# Patient Record
Sex: Male | Born: 1955 | Hispanic: No | Marital: Married | Smoking: Never smoker
Health system: Southern US, Community
[De-identification: ages and names within clinical notes are randomized; demographics above are authoritative.]

## PROBLEM LIST (undated history)

## (undated) DIAGNOSIS — G510 Bell's palsy: Secondary | ICD-10-CM

## (undated) HISTORY — DX: Bell's palsy: G51.0

## (undated) HISTORY — PX: HAND SURGERY: SHX662

---

## 2017-03-21 ENCOUNTER — Encounter (HOSPITAL_COMMUNITY): Payer: Self-pay | Admitting: Emergency Medicine

## 2017-03-21 ENCOUNTER — Emergency Department (HOSPITAL_COMMUNITY): Payer: 59

## 2017-03-21 ENCOUNTER — Emergency Department (HOSPITAL_COMMUNITY)
Admission: EM | Admit: 2017-03-21 | Discharge: 2017-03-21 | Disposition: A | Discharge status: Home / Self Care | Payer: 59 | Attending: Emergency Medicine | Admitting: Emergency Medicine

## 2017-03-21 DIAGNOSIS — Z5181 Encounter for therapeutic drug level monitoring: Secondary | ICD-10-CM | POA: Insufficient documentation

## 2017-03-21 DIAGNOSIS — I639 Cerebral infarction, unspecified: Secondary | ICD-10-CM | POA: Diagnosis not present

## 2017-03-21 DIAGNOSIS — R200 Anesthesia of skin: Secondary | ICD-10-CM | POA: Diagnosis present

## 2017-03-21 DIAGNOSIS — Z79899 Other long term (current) drug therapy: Secondary | ICD-10-CM | POA: Diagnosis not present

## 2017-03-21 DIAGNOSIS — G510 Bell's palsy: Secondary | ICD-10-CM

## 2017-03-21 DIAGNOSIS — R29810 Facial weakness: Secondary | ICD-10-CM

## 2017-03-21 DIAGNOSIS — R29818 Other symptoms and signs involving the nervous system: Secondary | ICD-10-CM | POA: Diagnosis not present

## 2017-03-21 LAB — URINALYSIS, ROUTINE W REFLEX MICROSCOPIC
Bacteria, UA: NONE SEEN
Bilirubin Urine: NEGATIVE
GLUCOSE, UA: NEGATIVE mg/dL
Ketones, ur: NEGATIVE mg/dL
Leukocytes, UA: NEGATIVE
NITRITE: NEGATIVE
PH: 7 (ref 5.0–8.0)
PROTEIN: NEGATIVE mg/dL
SPECIFIC GRAVITY, URINE: 1.011 (ref 1.005–1.030)
Squamous Epithelial / LPF: NONE SEEN

## 2017-03-21 LAB — I-STAT CHEM 8, ED
BUN: 11 mg/dL (ref 6–20)
CALCIUM ION: 1.11 mmol/L — AB (ref 1.15–1.40)
CHLORIDE: 106 mmol/L (ref 101–111)
Creatinine, Ser: 1 mg/dL (ref 0.61–1.24)
Glucose, Bld: 94 mg/dL (ref 65–99)
HCT: 45 % (ref 39.0–52.0)
Hemoglobin: 15.3 g/dL (ref 13.0–17.0)
Potassium: 3.9 mmol/L (ref 3.5–5.1)
Sodium: 140 mmol/L (ref 135–145)
TCO2: 25 mmol/L (ref 0–100)

## 2017-03-21 LAB — COMPREHENSIVE METABOLIC PANEL
ALK PHOS: 59 U/L (ref 38–126)
ALT: 26 U/L (ref 17–63)
AST: 27 U/L (ref 15–41)
Albumin: 4.3 g/dL (ref 3.5–5.0)
Anion gap: 9 (ref 5–15)
BUN: 11 mg/dL (ref 6–20)
CALCIUM: 9 mg/dL (ref 8.9–10.3)
CHLORIDE: 107 mmol/L (ref 101–111)
CO2: 23 mmol/L (ref 22–32)
CREATININE: 0.99 mg/dL (ref 0.61–1.24)
GFR calc non Af Amer: >60 mL/min (ref >60)
GLUCOSE: 99 mg/dL (ref 65–99)
Potassium: 4.1 mmol/L (ref 3.5–5.1)
SODIUM: 139 mmol/L (ref 135–145)
Total Bilirubin: 0.8 mg/dL (ref 0.3–1.2)
Total Protein: 7.6 g/dL (ref 6.5–8.1)

## 2017-03-21 LAB — DIFFERENTIAL
BASOS ABS: 0 10*3/uL (ref 0.0–0.1)
BASOS PCT: 1 %
Eosinophils Absolute: 0.2 10*3/uL (ref 0.0–0.7)
Eosinophils Relative: 3 %
Lymphocytes Relative: 26 %
Lymphs Abs: 1.7 10*3/uL (ref 0.7–4.0)
MONO ABS: 0.5 10*3/uL (ref 0.1–1.0)
Monocytes Relative: 7 %
NEUTROS ABS: 4.2 10*3/uL (ref 1.7–7.7)
Neutrophils Relative %: 63 %

## 2017-03-21 LAB — RAPID URINE DRUG SCREEN, HOSP PERFORMED
AMPHETAMINES: NOT DETECTED
BARBITURATES: NOT DETECTED
BENZODIAZEPINES: NOT DETECTED
Cocaine: NOT DETECTED
Opiates: NOT DETECTED
TETRAHYDROCANNABINOL: NOT DETECTED

## 2017-03-21 LAB — I-STAT TROPONIN, ED: Troponin i, poc: 0 ng/mL (ref 0.00–0.08)

## 2017-03-21 LAB — APTT: APTT: 27 s (ref 24–36)

## 2017-03-21 LAB — PROTIME-INR
INR: 0.97
Prothrombin Time: 12.9 seconds (ref 11.4–15.2)

## 2017-03-21 LAB — CBC
HEMATOCRIT: 45.4 % (ref 39.0–52.0)
Hemoglobin: 16.1 g/dL (ref 13.0–17.0)
MCH: 33.1 pg (ref 26.0–34.0)
MCHC: 35.5 g/dL (ref 30.0–36.0)
MCV: 93.2 fL (ref 78.0–100.0)
PLATELETS: 207 10*3/uL (ref 150–400)
RBC: 4.87 MIL/uL (ref 4.22–5.81)
RDW: 13 % (ref 11.5–15.5)
WBC: 6.5 10*3/uL (ref 4.0–10.5)

## 2017-03-21 LAB — ETHANOL: Alcohol, Ethyl (B): <5 mg/dL (ref <5)

## 2017-03-21 MED ORDER — ARTIFICIAL TEARS OPHTHALMIC OINT: TOPICAL_OINTMENT | Freq: Every day | OPHTHALMIC | 0 refills | Status: AC

## 2017-03-21 MED ORDER — VALACYCLOVIR HCL 1 G PO TABS: 1000.0000 mg | ORAL_TABLET | Freq: Three times a day (TID) | ORAL | 0 refills | Status: DC

## 2017-03-21 MED ORDER — PREDNISONE 20 MG PO TABS: ORAL_TABLET | ORAL | 0 refills | Status: DC

## 2017-03-21 MED ORDER — VALACYCLOVIR HCL 500 MG PO TABS
1000.0000 mg | ORAL_TABLET | Freq: Once | ORAL | Status: AC
  Administered 2017-03-21: 1000 mg via ORAL
  Filled 2017-03-21: qty 2

## 2017-03-21 MED ORDER — PREDNISONE 20 MG PO TABS
60.0000 mg | ORAL_TABLET | Freq: Once | ORAL | Status: AC
  Administered 2017-03-21: 60 mg via ORAL
  Filled 2017-03-21: qty 3

## 2017-03-21 NOTE — ED Provider Notes (Signed)
[facility]-EMERGENCY DEPT Provider Note   CSN: [id number] Arrival date & time: 03/21/17  1737     History   Chief Complaint Chief Complaint  Patient presents with  . Numbness    HPI Jeff Sandoval is a 61 y.o. male.  HPI  61 year old male presents with acute right facial weakness and numbness. He states he's had a mild to moderate headache for the last couple days. Today after lunch at around 2:30 PM he noticed a sudden facial droop and an inability to close his right eye. Denies any weakness or numbness in his extremities. He denies any known medical problems, also states he doesn't go to the doctor.  History reviewed. No pertinent past medical history.  There are no active problems to display for this patient.   History reviewed. No pertinent surgical history.     Home Medications    Prior to Admission medications   Medication Sig Start Date End Date Taking? Authorizing Provider  ibuprofen (ADVIL,MOTRIN) 200 MG tablet Take 400-600 mg by mouth every 6 (six) hours as needed for headache.   Yes [provider]  Multiple Vitamins-Minerals (ONE-A-DAY MENS 50+ ADVANTAGE) TABS Take 1 tablet by mouth daily.   Yes [provider]  vitamin E 400 UNIT capsule Take 400 Units by mouth daily.   Yes [provider]  artificial tears (LACRILUBE) OINT ophthalmic ointment Place into the right eye at bedtime. 03/21/17   Pricilla Loveless, MD  predniSONE (DELTASONE) 20 MG tablet 3 tabs po daily x 2 days, then 2 tabs x 3 days, then 1.5 tabs x 3 days, then 1 tab x 3 days, then 0.5 tabs x 3 days 03/22/17   Pricilla Loveless, MD  valACYclovir (VALTREX) 1000 MG tablet Take 1 tablet (1,000 mg total) by mouth 3 (three) times daily. 03/21/17   Pricilla Loveless, MD    Family History No family history on file.  Social History Social History  Substance Use Topics  . Smoking status: Never Smoker  . Smokeless tobacco: Never Used  . Alcohol use 0.6 oz/week    1 Glasses of wine per  week     Allergies   Patient has no known allergies.   Review of Systems Review of Systems  HENT: Negative for ear pain.   Eyes: Negative for visual disturbance.  Cardiovascular: Negative for chest pain.  Neurological: Positive for facial asymmetry, weakness, numbness (facial) and headaches. Negative for dizziness.  All other systems reviewed and are negative.    Physical Exam Updated Vital Signs BP (!) 131/106   Pulse 70   Temp 97.7 F (36.5 C)   Resp 13   Ht 6\' 3"  (1.905 m)   Wt 216 lb 4 oz (98.1 kg)   SpO2 97%   BMI 27.03 kg/m   Physical Exam  Constitutional: He is oriented to person, place, and time. He appears well-developed and well-nourished.  HENT:  Head: Normocephalic and atraumatic.  Right Ear: External ear normal.  Left Ear: External ear normal.  Nose: Nose normal.  Eyes: EOM are normal. Pupils are equal, round, and reactive to light. Right eye exhibits no discharge. Left eye exhibits no discharge.  Neck: Neck supple.  Cardiovascular: Normal rate, regular rhythm and normal heart sounds.   Pulmonary/Chest: Effort normal and breath sounds normal.  Abdominal: Soft. There is no tenderness.  Musculoskeletal: He exhibits no edema.  Neurological: He is alert and oriented to person, place, and time.  Right facial droop and asymmetry of mouth and maxillary cheek when  smiling. Unable to close right eye. Forehead probably moves less but is not paralyzed. 5/5 strength in all 4 extremities  Skin: Skin is warm and dry.  Nursing note and vitals reviewed.    ED Treatments / Results  Labs (all labs ordered are listed, but only abnormal results are displayed) Labs Reviewed  URINALYSIS, ROUTINE W REFLEX MICROSCOPIC - Abnormal; Notable for the following:       Result Value   Color, Urine STRAW (*)    Hgb urine dipstick SMALL (*)    All other components within normal limits  I-STAT CHEM 8, ED - Abnormal; Notable for the following:    Calcium, Ion 1.11 (*)    All  other components within normal limits  ETHANOL  PROTIME-INR  APTT  CBC  DIFFERENTIAL  COMPREHENSIVE METABOLIC PANEL  RAPID URINE DRUG SCREEN, HOSP PERFORMED  I-STAT TROPOININ, ED    EKG  EKG Interpretation  Date/Time:  Thursday Mar 21 2017 17:44:03 EDT Ventricular Rate:  78 PR Interval:  144 QRS Duration: 92 QT Interval:  382 QTC Calculation: 435 R Axis:   65 Text Interpretation:  Normal sinus rhythm Cannot rule out Anterior infarct , age undetermined Abnormal ECG No old tracing to compare Confirmed by Pricilla Loveless [phone]) on 03/21/2017 6:05:37 PM       Radiology Mr Brain Wo Contrast  Result Date: 03/21/2017 CLINICAL DATA:  Acute RIGHT facial droop beginning at 2:30 this afternoon. Mild-to-moderate headache for a few days. Assess for stroke. EXAM: MRI HEAD WITHOUT CONTRAST TECHNIQUE: Multiplanar, multiecho pulse sequences of the brain and surrounding structures were obtained without intravenous contrast. COMPARISON:  CT HEAD Mar 21, 2017 at 1803 hours FINDINGS: BRAIN: No reduced diffusion to suggest acute ischemia. Minimal artifact LEFT mesial frontal lobe due to to susceptibility artifact from falcine calcifications. No susceptibility artifact to suggest hemorrhage. The ventricles and sulci are normal for patient's age. No suspicious parenchymal signal, masses or mass effect. No abnormal extra-axial fluid collections. VASCULAR: Normal major intracranial vascular flow voids present at skull base. SKULL AND UPPER CERVICAL SPINE: No abnormal sellar expansion. No suspicious calvarial bone marrow signal. Craniocervical junction maintained. SINUSES/ORBITS: The mastoid air-cells and included paranasal sinuses are well-aerated. The included ocular globes and orbital contents are non-suspicious. OTHER: None. IMPRESSION: Negative noncontrast MRI head. Electronically Signed   By: Awilda Metro M.D.   On: 03/21/2017 20:48   Ct Head Code Stroke W/o Cm  Result Date: 03/21/2017 CLINICAL  DATA:  Code stroke. Initial evaluation for acute right facial numbness. EXAM: CT HEAD WITHOUT CONTRAST TECHNIQUE: Contiguous axial images were obtained from the base of the skull through the vertex without intravenous contrast. COMPARISON:  None. FINDINGS: Brain: Cerebral volume within normal limits. No acute intracranial hemorrhage. No evidence for acute large vessel territory infarct. No mass lesion, midline shift or mass effect. No hydrocephalus. No extra-axial fluid collection. Vascular: No asymmetric hyperdense vessel. Skull: Scalp soft tissues within normal limits.  Calvarium intact. Sinuses/Orbits: Globes and oval soft tissues within normal limits. Visualized paranasal sinuses and mastoids are clear. ASPECTS [facility] Stroke Program Early CT Score) - Ganglionic level infarction (caudate, lentiform nuclei, internal capsule, insula, M1-M3 cortex): 7 - Supraganglionic infarction (M4-M6 cortex): 3 Total score (0-10 with 10 being normal): 10 IMPRESSION: 1. No acute intracranial infarct or other process identified. 2. ASPECTS is 10 Critical Value/emergent results were called by telephone at the time of interpretation on 03/21/2017 at 6:32 pm to Dr. Roxy Manns , who verbally acknowledged these results. Radiology is identified sac  and cement Electronically Signed   By: Rise MuBenjamin  McClintock M.D.   On: 03/21/2017 18:34    Procedures Procedures (including critical care time)  Medications Ordered in ED Medications  predniSONE (DELTASONE) tablet 60 mg (60 mg Oral Given 03/21/17 2225)  valACYclovir (VALTREX) tablet 1,000 mg (1,000 mg Oral Given 03/21/17 2227)     Initial Impression / Assessment and Plan / ED Course  I have reviewed the triage vital signs and the nursing notes.  Pertinent labs & imaging results that were available during my care of the patient were reviewed by me and considered in my medical decision making (see chart for details).  Clinical Course as of Mar 21 2353  Thu Mar 21, 2017  1825 Dr  Roxy Mannsster agrees, probably bell's but not a typical one, thus get MRI to help r/o CVA.  [SG]    Clinical Course User Index [SG] Pricilla LovelessGoldston, Quintin Hjort, MD    MRI is negative, thus he will be treated for Bell's palsy. Discussed need for PCP and ENT follow-up. Discussed strict return precautions. Placed on steroids and valacyclovir. Discussed need for artificial tears during the day and Lacri-Lube at night for his right eye.  Final Clinical Impressions(s) / ED Diagnoses   Final diagnoses:  Bell's palsy    New Prescriptions Discharge Medication List as of 03/21/2017  9:56 PM    START taking these medications   Details  artificial tears (LACRILUBE) OINT ophthalmic ointment Place into the right eye at bedtime., Starting Thu 03/21/2017, Print    predniSONE (DELTASONE) 20 MG tablet 3 tabs po daily x 2 days, then 2 tabs x 3 days, then 1.5 tabs x 3 days, then 1 tab x 3 days, then 0.5 tabs x 3 days, Print    valACYclovir (VALTREX) 1000 MG tablet Take 1 tablet (1,000 mg total) by mouth 3 (three) times daily., Starting Thu 03/21/2017, Print         Pricilla LovelessGoldston, Ibraham Levi, MD 03/21/17 [phone]

## 2017-03-21 NOTE — ED Notes (Signed)
Code stroke activated @ 1758

## 2017-03-21 NOTE — ED Notes (Signed)
Patient transported to MRI 

## 2017-03-21 NOTE — Consult Note (Signed)
Neurology Consult Note  Reason for Consultation: CODE STROKE  Requesting provider: Sherwood Gambler, MD  CC: Headache, right facial droop  HPI: This is a 61 year old right-handed man who presents to the emergency department for evaluation of the acute onset of right facial weakness earlier today. History is obtained directly from the patient who is an excellent historian.  The patient reports that he was at his office today at about 1430 when he explains the abrupt onset of weakness on the right side of his face. He also describes some mild tingling and numbness in the right cheek. He denies any other symptoms, specifically no vision loss, double vision, hearing changes, weakness or numbness in the arms or legs, balance impairments, or discoordination. He has had a headache. His headache started 2 days ago and was initially described as a bitemporal headache without associated features. Earlier today, he states the pain moved behind his right eye before settling behind his right ear. 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 He decided to come to the emergency Department to be evaluated for possible stroke.  On arrival in the ED, he was seen by the ED physician who noted right facial droop that appear to spare the forehead. Code stroke was activated. The patient was taken for an emergent CT scan of the head which was unremarkable. I met the patient in the ED and on my assessment, he has a right facial droop with very slight subjective sensory loss in the right arm. NIH stroke score was 3. However, due to mild symptoms and suspicion that his facial symptoms may actually be due to a peripheral seventh nerve palsy, the decision was made not to administer thrombolytic therapy.  He denies any recent URI symptoms or ear infections. He has not appreciated any hyperacusis. He has not had any change in taste sensation. He denies any previous similar  symptoms.   LKW: 1430 today NIHSS score: 3 tPA given?: No, mild deficits and moderate suspicion for alternate diagnosis  PMH:  The patient denies any chronic medical problems.  PSH:  The patient denies any prior surgeries.  Family history: CAD in his father in his 1s. His mother had dementia.   Social history:  Married. He works for an [employer], travels constantly for work. He works part-time as an Public relations account executive on weekends. He denies any alcohol use. He drinks wine on occasion. He denies any illicit drug use.    Current outpatient meds: Medications reviewed and reconciled.  Current Meds  Medication Sig  . ibuprofen (ADVIL,MOTRIN) 200 MG tablet Take 400-600 mg by mouth every 6 (six) hours as needed for headache.  . Multiple Vitamins-Minerals (ONE-A-DAY MENS 50+ ADVANTAGE) TABS Take 1 tablet by mouth daily.  . vitamin E 400 UNIT capsule Take 400 Units by mouth daily.    Current inpatient meds: Medications reviewed and reconciled.  No current facility-administered medications for this encounter.    Current Outpatient Prescriptions  Medication Sig Dispense Refill  . ibuprofen (ADVIL,MOTRIN) 200 MG tablet Take 400-600 mg by mouth every 6 (six) hours as needed for headache.    . Multiple Vitamins-Minerals (ONE-A-DAY MENS 50+ ADVANTAGE) TABS Take 1 tablet by mouth daily.    . vitamin E 400 UNIT capsule Take 400 Units by mouth daily.      Allergies: No Known Allergies  ROS: As per HPI. A full 14-point review of systems was performed and is otherwise unremarkable.  PE:  BP (!) 140/106 (BP Location: Right Arm)   Pulse 75   Temp 97.8 F (36.6 C) (Oral)   Resp 16   Ht 6' 3"  (1.905 m)   Wt 98.1 kg (216 lb 4 oz)   SpO2 98%   BMI 27.03 kg/m   General: WDWN, no acute distress. AAO x4. Speech clear, no dysarthria. No aphasia. Follows commands briskly. Affect is bright with congruent mood. Comportment is normal.  HEENT: Normocephalic. Neck supple without LAD. MMM, OP clear.  Dentition good. Sclerae anicteric. No conjunctival injection. He has mild tenderness over the R mastoid.  CV: Regular, no murmur. Carotid pulses full and symmetric, no bruits. Distal pulses 2+ and symmetric.  Lungs: CTAB.  Abdomen: Soft, non-distended, non-tender. Bowel sounds present x4.  Extremities: No C/C/E. Neuro:  CN: Pupils are equal and round. They are symmetrically reactive from 3-->2 mm. EOMI without nystagmus. No reported diplopia. Facial sensation is intact to light touch. There is widening of the R palpebral fissure, mild flattening of the R nasolabial fold, and slightly reduced wrinkling of the R forehead at rest. He has decreased eye closure and an asymmetric R smile. His forehead movement appears largely symmetric with perhaps subtle weakness of brow elevation on the R. Hearing is intact to conversational voice. Palate elevates symmetrically and uvula is midline. Voice is normal in tone, pitch and quality. Bilateral SCM and trapezii are 5/5. Tongue is midline with normal bulk and mobility.  Motor: Normal bulk, tone, and strength. No tremor or other abnormal movements. No drift.  Sensation: Intact to light touch. Pinprick is subtly reduced over the R arm.  DTRs: 2+, symmetric. Toes downgoing bilaterally. No pathologic reflexes.  Coordination: Finger-to-nose and heel-to-shin are without dysmetria. Finger taps are normal in amplitude and speed, no decrement.  Gait: Casual gait is normal.  Labs:  Lab Results  Component Value Date   WBC 6.5 03/21/2017   HGB 15.3 03/21/2017   HCT 45.0 03/21/2017   PLT 207 03/21/2017   GLUCOSE 94 03/21/2017   ALT 26 03/21/2017   AST 27 03/21/2017   NA 140 03/21/2017   K 3.9 03/21/2017   CL 106 03/21/2017   CREATININE 1.00 03/21/2017   BUN 11 03/21/2017   CO2 23 03/21/2017   INR 0.97 03/21/2017   EtOH <5 PTT 27 Troponin 0.00  Imaging:  I have personally and independently reviewed the [facility] without contrast from today. This is unremarkable  with no acute abnormality. This was discussed with the interpreting radiologist at the time of my visit.   Assessment and Plan:  1. R facial droop: Given preceding headache with R mastoid tenderness followed by R facial weakness with subtle involvement of the R forehead, I suspect this is most likely a peripheral process, probably Bell's palsy. However, given relative sparing of the forehead with subtle subjective sensory loss in the RUE, recommend MRI brain to exclude infarction. If MRI shows no stroke, then would treat with prednisone taper starting at 60 mg and tapering over 7-10 days. If MRI shows stroke he will need to be admitted for further evaluation.   This was discussed with the patient. Education was provided on the diagnosis and expected evaluation and treatment. He is in agreement with the plan as noted. He was given the opportunity to ask any questions and these were addressed to his satisfaction.   I discussed my impression and recommendations with the ED MD, Dr. Regenia Skeeter, at the time of my visit.

## 2017-03-21 NOTE — ED Triage Notes (Signed)
Pt st's he woke up this am with a headache on right side of head.  St's today at 3:00 while sitting at his desk work on computer he developed slight numbness in right side of face.  Unable to close right eye.  No other weakness noted at this time.  Dr. Criss AlvineGoldston called to assess pt in triage for possible code stroke

## 2017-03-21 NOTE — ED Provider Notes (Signed)
MSE was initiated and I personally evaluated the patient and placed orders (if any) at  5:56 PM on Mar 21, 2017.  Patient presents with acute right-sided facial droop. This started around 2:30 this afternoon. He's had a mild to moderate headache for a couple days but then all of a sudden after lunch noticed the droop and inability to close his right eye. No other symptoms. Headache today is a little bit worse but not significant. My suspicion is this is most likely a Bell's palsy. He has no known medical problems but also doesn't see a primary doctor. However on exam he has normal for head/frontalis muscle function. Thus he needs workup for stroke before determining this is Bell's palsy. Will call code stroke given the acute onset and bring back to an acute care room.  The patient appears stable so that the remainder of the MSE may be completed by another provider.   Pricilla LovelessGoldston, Jalien Weakland, MD 03/21/17 [phone]

## 2017-05-28 DIAGNOSIS — G519 Disorder of facial nerve, unspecified: Secondary | ICD-10-CM | POA: Diagnosis not present

## 2017-05-28 DIAGNOSIS — Z23 Encounter for immunization: Secondary | ICD-10-CM | POA: Diagnosis not present

## 2017-06-03 ENCOUNTER — Encounter (INDEPENDENT_AMBULATORY_CARE_PROVIDER_SITE_OTHER): Payer: Self-pay

## 2017-06-03 ENCOUNTER — Encounter: Payer: Self-pay | Admitting: Neurology

## 2017-06-03 ENCOUNTER — Ambulatory Visit (INDEPENDENT_AMBULATORY_CARE_PROVIDER_SITE_OTHER): Payer: 59 | Admitting: Neurology

## 2017-06-03 DIAGNOSIS — G510 Bell's palsy: Secondary | ICD-10-CM | POA: Diagnosis not present

## 2017-06-03 NOTE — Patient Instructions (Addendum)
Bell Palsy, Adult Bell palsy is a short-term inability to move muscles in part of the face. The inability to move (paralysis) results from inflammation or compression of the facial nerve, which travels along the skull and under the ear to the side of the face (7th cranial nerve). This nerve is responsible for facial movements that include blinking, closing the eyes, smiling, and frowning. What are the causes? The exact cause of this condition is not known. It may be caused by an infection from a virus, such as the chickenpox (herpes zoster), Epstein-Barr, or mumps virus. What increases the risk? You are more likely to develop this condition if:  You are pregnant.  You have diabetes.  You have had a recent infection in your nose, throat, or airways (upper respiratory infection).  You have a weakened body defense system (immune system).  You have had a facial injury, such as a fracture.  You have a family history of Bell palsy.  What are the signs or symptoms? Symptoms of this condition include:  Weakness on one side of the face.  Drooping eyelid and corner of the mouth.  Excessive tearing in one eye.  Difficulty closing the eyelid.  Dry eye.  Drooling.  Dry mouth.  Changes in taste.  Change in facial appearance.  Pain behind one ear.  Ringing in one or both ears.  Sensitivity to sound in one ear.  Facial twitching.  Headache.  Impaired speech.  Dizziness.  Difficulty eating or drinking.  Most of the time, only one side of the face is affected. Rarely, Bell palsy affects the whole face. How is this diagnosed? This condition is diagnosed based on:  Your symptoms.  Your medical history.  A physical exam.  You may also have to see health care providers who specialize in disorders of the nerves (neurologist) or diseases and conditions of the eye (ophthalmologist). You may have tests, such as:  A test to check for nerve damage (electromyogram).  Imaging  studies, such as CT or MRI scans.  Blood tests.  How is this treated? This condition affects every person differently. Sometimes symptoms go away without treatment within a couple weeks. If treatment is needed, it varies from person to person. The goal of treatment is to reduce inflammation and protect the eye from damage. Treatment for Bell palsy may include:  Medicines, such as: ? Steroids to reduce swelling and inflammation. ? Antiviral drugs. ? Pain relievers, including aspirin, acetaminophen, or ibuprofen.  Eye drops or ointment to keep your eye moist.  Eye protection, if you cannot close your eye.  Exercises or massage to regain muscle strength and function (physical therapy).  Follow these instructions at home:  Take over-the-counter and prescription medicines only as told by your health care provider.  If your eye is affected: ? Keep your eye moist with eye drops or ointment as told by your health care provider. ? Follow instructions for eye care and protection as told by your health care provider.  Do any physical therapy exercises as told by your health care provider.  Keep all follow-up visits as told by your health care provider. This is important. Contact a health care provider if:  You have a fever.  Your symptoms do not get better within 2-3 weeks, or your symptoms get worse.  Your eye is red, irritated, or painful.  You have new symptoms. Get help right away if:  You have weakness or numbness in a part of your body other than your face.    You have trouble swallowing.  You develop neck pain or stiffness.  You develop dizziness or shortness of breath. Summary  Bell palsy is a short-term inability to move muscles in part of the face. The inability to move (paralysis) results from inflammation or compression of the facial nerve.  This condition affects every person differently. Sometimes symptoms go away without treatment within a couple weeks.  If  treatment is needed, it varies from person to person. The goal of treatment is to reduce inflammation and protect the eye from damage.  Contact your health care provider if your symptoms do not get better within 2-3 weeks, or your symptoms get worse. This information is not intended to replace advice given to you by your health care provider. Make sure you discuss any questions you have with your health care provider. Document Released: 10/22/2005 Document Revised: 12/25/2016 Document Reviewed: 12/25/2016 Elsevier Interactive Patient Education  2018 [employer]  

## 2017-06-03 NOTE — Progress Notes (Signed)
Reason for visit: Right Bell's palsy  Referring physician: Dr. Nash Sandoval  Jeff Sandoval is a 61 y.o. male  History of present illness:  Jeff Sandoval is a 61 year old right-handed white male with a history of onset of Bell's palsy around 03/21/2017. The patient had a right retro-orbital and periauricular headache the day before. The patient noted onset of right facial weakness the next day when he was at work. The patient went to the emergency room and a MRI of the brain was done was unremarkable. The patient developed significant weakness of the right face, he denied any changes in taste sensation or any hyperacusis involving the right ear. Initially, he was not able to close the right eye, but this has started to return. He has begun to have the ability to raise the eyebrow on the right. He denies numbness or weakness on the arms or legs or body. He denies any double vision or loss of vision, he denies any problems with swallowing but he does have difficulty with speech with generating labial sounds. The patient denies problems with balance or difficulty controlling the bowels or the bladder. He is sent to this office for an evaluation.  Past Medical History:  Diagnosis Date  . Bell's palsy     Past Surgical History:  Procedure Laterality Date  . HAND SURGERY     Age 89    Family History  Problem Relation Age of Onset  . Dementia Mother   . Prostate cancer Father     Social history:  reports that he has never smoked. He has never used smokeless tobacco. He reports that he drinks about 0.6 oz of alcohol per week . He reports that he does not use drugs.  Medications:  Prior to Admission medications   Medication Sig Start Date End Date Taking? Authorizing Provider  artificial tears (LACRILUBE) OINT ophthalmic ointment Place into the right eye at bedtime. 03/21/17  Yes Pricilla LovelessGoldston, Scott, MD  b complex vitamins capsule Take 1 capsule by mouth daily.   Yes [provider]    ibuprofen (ADVIL,MOTRIN) 200 MG tablet Take 400-600 mg by mouth every 6 (six) hours as needed for headache.   Yes [provider]  Multiple Vitamins-Minerals (ONE-A-DAY MENS 50+ ADVANTAGE) TABS Take 1 tablet by mouth daily.   Yes [provider]  vitamin E 400 UNIT capsule Take 400 Units by mouth daily.   Yes [provider]     No Known Allergies  ROS:  Out of a complete 14 system review of symptoms, the patient complains only of the following symptoms, and all other reviewed systems are negative.  Right facial weakness  Blood pressure 120/75, pulse 82, height 6\' 3"  (1.905 m), weight 187 lb (84.8 kg).  Physical Exam  General: The patient is alert and cooperative at the time of the examination.  Eyes: Pupils are equal, round, and reactive to light. Discs are flat bilaterally.  Neck: The neck is supple, no carotid bruits are noted.  Respiratory: The respiratory examination is clear.  Cardiovascular: The cardiovascular examination reveals a regular rate and rhythm, no obvious murmurs or rubs are noted.  Skin: Extremities are without significant edema.  Neurologic Exam  Mental status: The patient is alert and oriented x 3 at the time of the examination. The patient has apparent normal recent and remote memory, with an apparently normal attention span and concentration ability.  Cranial nerves: Facial symmetry is not present. There is good sensation of the face to  pinprick and soft touch bilaterally. The strength of the muscles to head turning and shoulder shrug are normal bilaterally. The patient has weakness of the right face, he does have ability to close the right eyelid with difficulty, he can slightly elevate the right eyebrow, no voluntary movement of the lower face on the right. Speech is well enunciated, no aphasia or dysarthria is noted. Extraocular movements are full. Visual fields are full. The tongue is midline, and the patient has symmetric  elevation of the soft palate. No obvious hearing deficits are noted.  Motor: The motor testing reveals 5 over 5 strength of all 4 extremities. Good symmetric motor tone is noted throughout.  Sensory: Sensory testing is intact to pinprick, soft touch, vibration sensation, and position sense on all 4 extremities. No evidence of extinction is noted.  Coordination: Cerebellar testing reveals good finger-nose-finger and heel-to-shin bilaterally.  Gait and station: Gait is normal. Tandem gait is normal. Romberg is negative. No drift is seen.  Reflexes: Deep tendon reflexes are symmetric and normal bilaterally. Toes are downgoing bilaterally.   MRI brain 03/21/17:  IMPRESSION: Negative noncontrast MRI head.  * MRI scan images were reviewed online. I agree with the written report.    Assessment/Plan:  1. Right Bell's palsy  The patient is already getting some improvement in strength of the right face with ability to close the eyelid and to raise the right eyebrow. I would expect over the next 4-6 weeks that he will get good benefit with healing. The patient is already able to close the right eyelid and to protect the cornea. He will follow-up through this office on an as-needed basis.  Jeff Sandoval. Jeff Breah Joa MD 06/03/2017 9:00 AM  [facility] [address] [place], [place] [id number]  Phone [phone] Fax [phone]

## 2017-12-12 DIAGNOSIS — J101 Influenza due to other identified influenza virus with other respiratory manifestations: Secondary | ICD-10-CM | POA: Diagnosis not present

## 2018-07-20 IMAGING — MR MR HEAD W/O CM
9 of 10 series · 34 of 48 positions shown · non-contrast
Comparison: CT HEAD March 21, 2017 at 8011 hours

CLINICAL DATA: Acute RIGHT facial droop beginning at [DATE] this
afternoon. Mild-to-moderate headache for a few days. Assess for
stroke.

EXAM:
MRI HEAD WITHOUT CONTRAST
TECHNIQUE: Multiplanar, multiecho pulse sequences of the brain and surrounding
structures were obtained without intravenous contrast.

[Series 3: FLAIR · sagittal · 5.0mm · 0.47mm/px · 2 of 23 slices shown (1 of 2)]
[im 1/23]
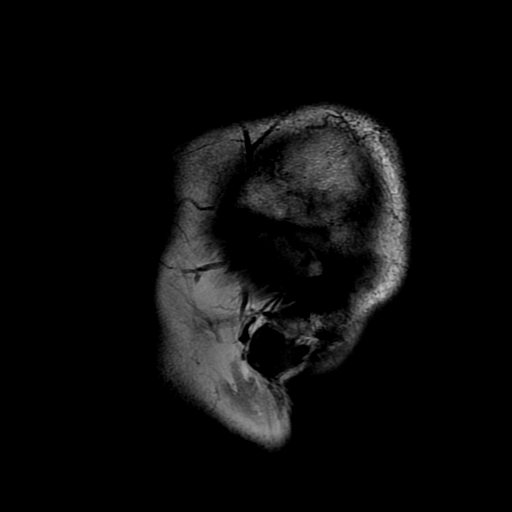
[im 23/23]
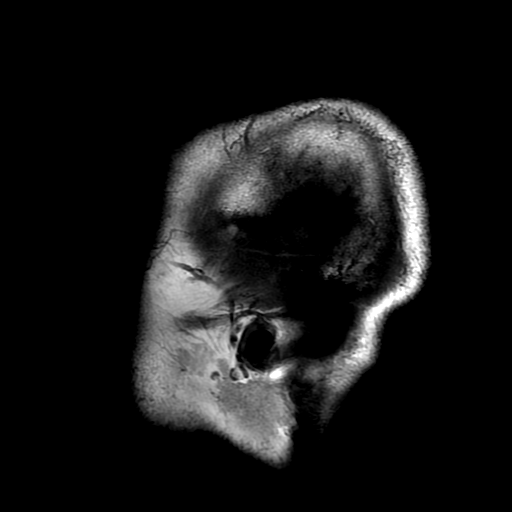

[Series 4: DWI · axial · 3.0mm · 0.94mm/px · z∈[-41,+115]mm · 8 of 106 slices shown (1 of 2)]
[im 1/106]
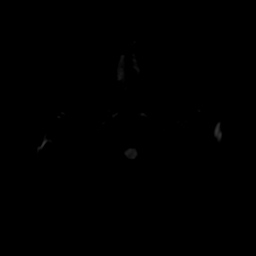
[im 12/106]
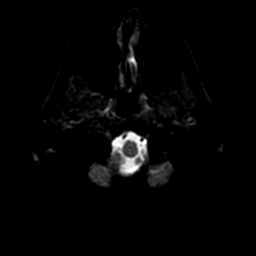
[im 36/106]
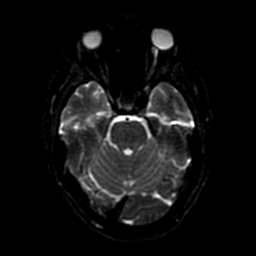
[im 47/106]
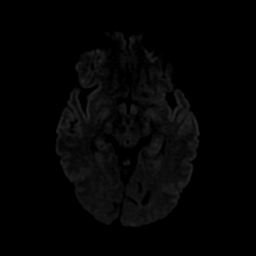
[im 59/106]
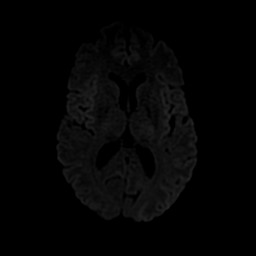
[im 71/106]
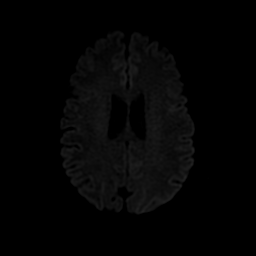
[im 94/106]
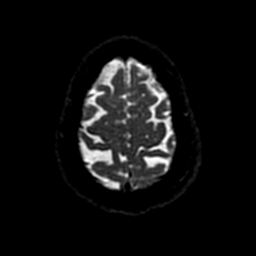
[im 106/106]
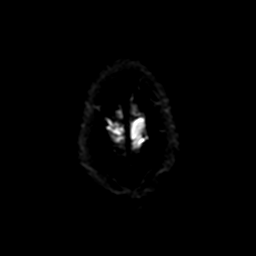

[Series 5: DWI · coronal · 4.0mm · 0.94mm/px · 6 of 72 slices shown (2 of 2)]
[im 1/72]
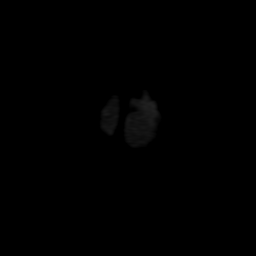
[im 15/72]
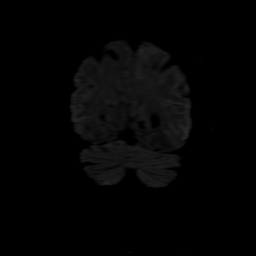
[im 29/72]
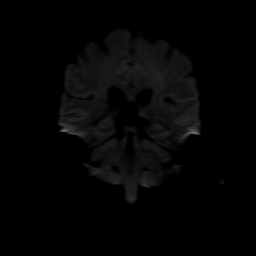
[im 43/72]
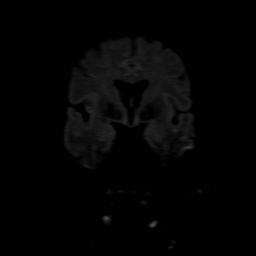
[im 57/72]
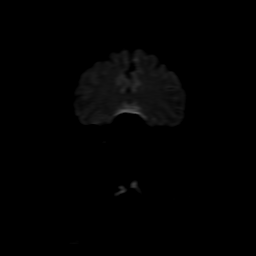
[im 72/72]
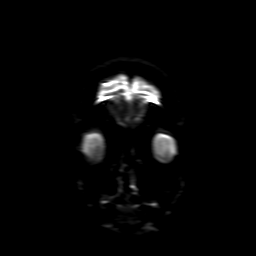

[Series 6: T2 · axial · 5.0mm · 0.47mm/px · z∈[-41,+115]mm · 2 of 27 slices shown (1 of 2)]
[im 1/27]
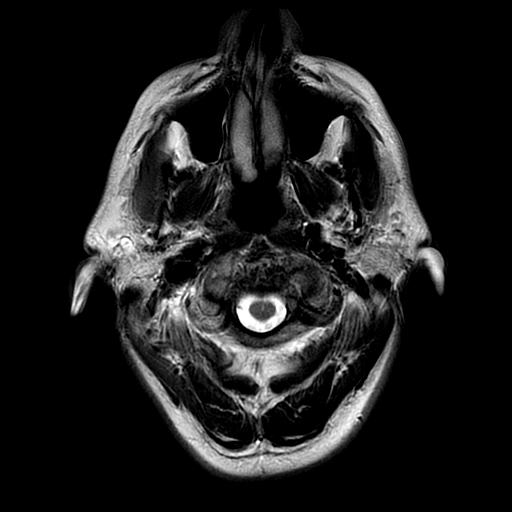
[im 27/27]
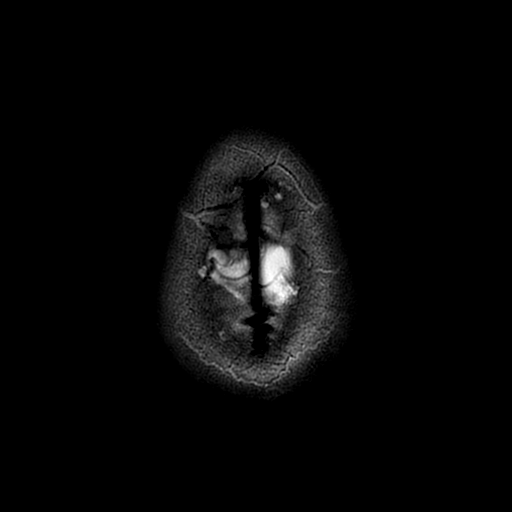

[Series 7: FLAIR · axial · 5.0mm · 0.47mm/px · z∈[-41,+115]mm · 2 of 27 slices shown (2 of 2)]
[im 1/27]
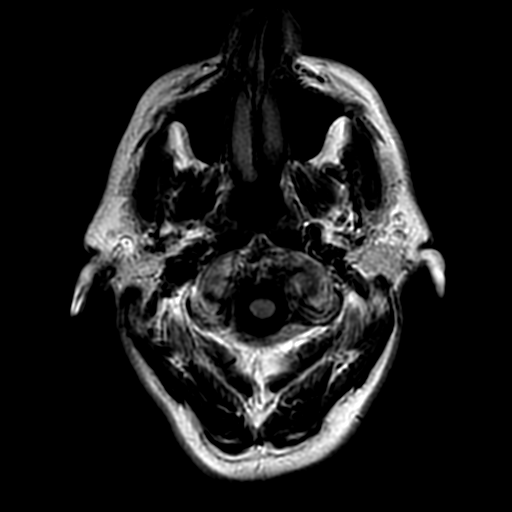
[im 27/27]
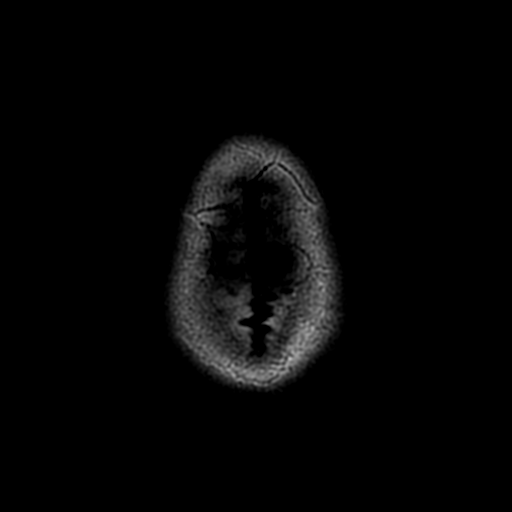

[Series 8: (person_name) · axial · 3.0mm · 0.47mm/px · z∈[-42,+10]mm · 3 of 108 slices shown]
[im 1/108]
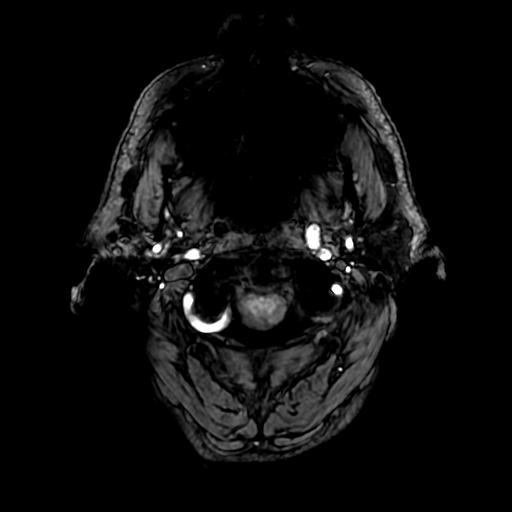
[im 12/108]
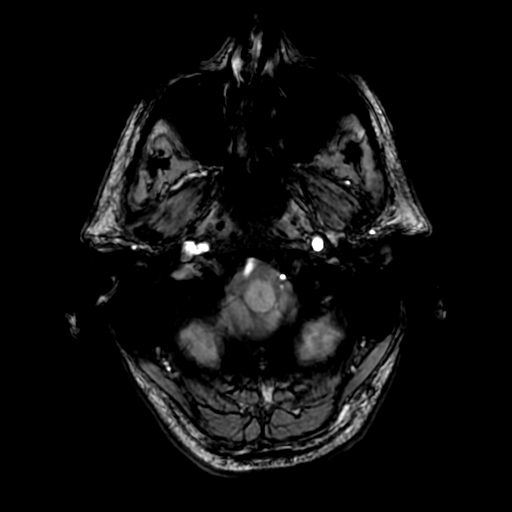
[im 36/108]
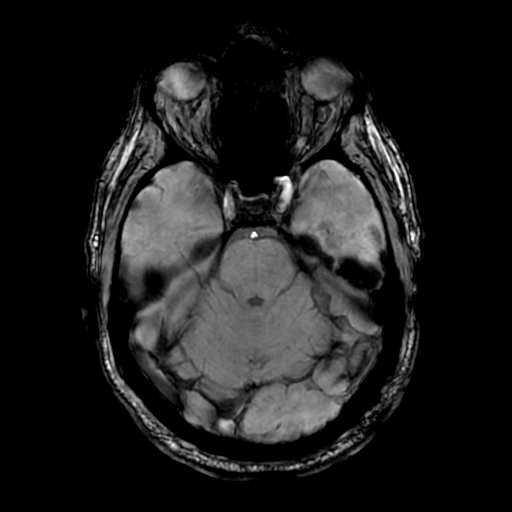

[Series 10: T2 · coronal · 5.0mm · 0.47mm/px · 3 of 30 slices shown (2 of 2)]
[im 1/30]
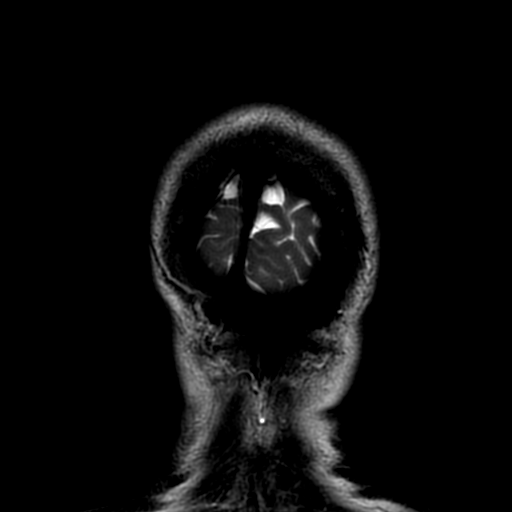
[im 15/30]
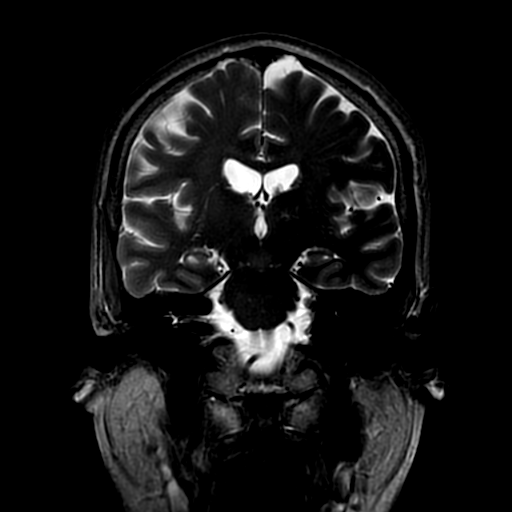
[im 30/30]
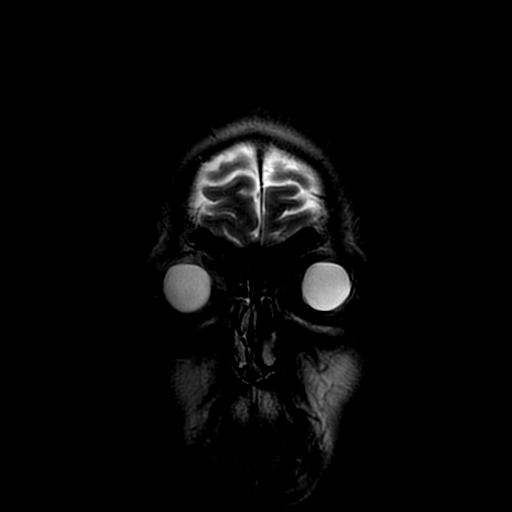

[Series 450: ADC · axial · 3.0mm · 0.94mm/px · z∈[-41,+115]mm · 5 of 53 slices shown (1 of 2)]
[im 1/53]
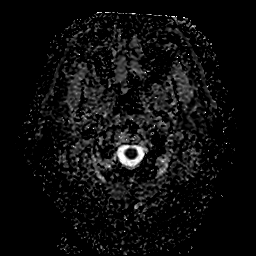
[im 14/53]
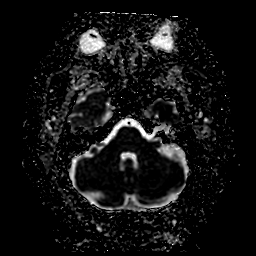
[im 27/53]
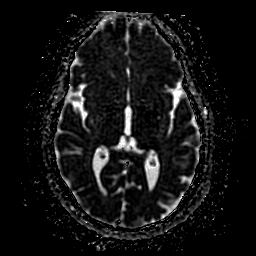
[im 40/53]
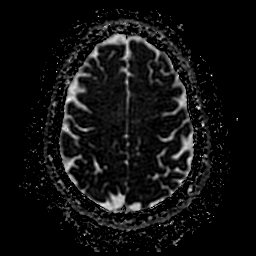
[im 53/53]
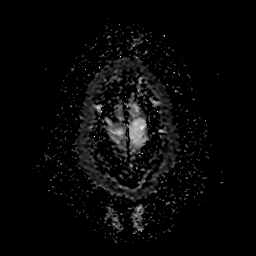

[Series 550: ADC · coronal · 4.0mm · 0.94mm/px · 3 of 36 slices shown (2 of 2)]
[im 1/36]
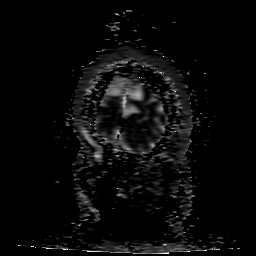
[im 18/36]
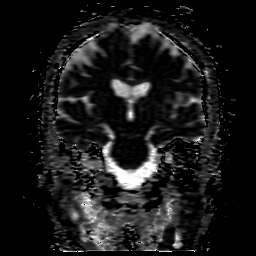
[im 36/36]
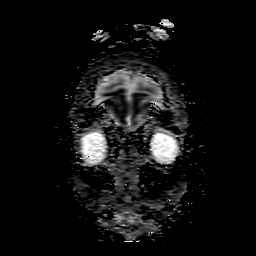

[34 of 48 positions shown; findings below may reference images not displayed]

FINDINGS: BRAIN: No reduced diffusion to suggest acute ischemia. Minimal
artifact LEFT mesial frontal lobe due to to susceptibility artifact
from falcine calcifications. No susceptibility artifact to suggest
hemorrhage. The ventricles and sulci are normal for patient's age.
No suspicious parenchymal signal, masses or mass effect. No abnormal
extra-axial fluid collections.

VASCULAR: Normal major intracranial vascular flow voids present at
skull base.

SKULL AND UPPER CERVICAL SPINE: No abnormal sellar expansion. No
suspicious calvarial bone marrow signal. Craniocervical junction
maintained.

SINUSES/ORBITS: The mastoid air-cells and included paranasal sinuses
are well-aerated. The included ocular globes and orbital contents
are non-suspicious.

OTHER: None.
IMPRESSION: Negative noncontrast MRI head.
